# Patient Record
Sex: Female | Born: 1975 | Race: Black or African American | Hispanic: No | Marital: Married
Health system: Southern US, Community
[De-identification: ages and names within clinical notes are randomized; demographics above are authoritative.]

## PROBLEM LIST (undated history)

## (undated) DIAGNOSIS — D219 Benign neoplasm of connective and other soft tissue, unspecified: Secondary | ICD-10-CM

---

## 2015-07-15 ENCOUNTER — Other Ambulatory Visit: Payer: Self-pay | Admitting: Specialist

## 2015-07-15 DIAGNOSIS — D259 Leiomyoma of uterus, unspecified: Secondary | ICD-10-CM

## 2015-07-30 ENCOUNTER — Other Ambulatory Visit (HOSPITAL_COMMUNITY): Payer: Self-pay | Admitting: Diagnostic Radiology

## 2015-07-30 ENCOUNTER — Ambulatory Visit
Admission: RE | Admit: 2015-07-30 | Discharge: 2015-07-30 | Disposition: A | Discharge status: Home / Self Care | Payer: Managed Care, Other (non HMO) | Source: Ambulatory Visit | Attending: Specialist | Admitting: Specialist

## 2015-07-30 DIAGNOSIS — D259 Leiomyoma of uterus, unspecified: Secondary | ICD-10-CM

## 2015-07-30 NOTE — Consult Note (Signed)
Chief Complaint: Patient was seen in consultation today for menorrhagia and uterine fibroids at the request of Dorn,Henry H  Referring Physician(s): Dorn,Henry H  History of Present Illness: Kelsey Gomez is a 39 y.o. female with known uterine fibroids and complains of heavy menstrual bleeding.  Pregnancy history is G3, P2, SA1. Patient was diagnosed with fibroids approximately 16 years ago during a pregnancy. The patient had a tubal ligation a few years ago and stopped taking birth control pills. At that time, the patient started to have increased menstrual bleeding. Initially, the menstrual bleeding was irregular but this improved with Provera. The menstrual cycle is approximately every 23 days with 10 days of bleeding.  4 days of heavy bleeding that requires both tampons and pads every 3 hours. She denies interperiod bleeding.  Patient has chronic fullness or discomfort in the left pelvic region and she says it "feels like she is ovulating all the time." The left pelvic symptoms did not change during her menstrual cycle. Patient has no other significant complaints except for diarrhea related to irritable bowel syndrome. Patient had a urinary tract infection last year but denies other pelvic infections.  Pap smear was negative in March 2015 and no record of an endometrial biopsy.  Patient had a tubal ligation and has no desire to have additional children. 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  Patient is a smoker and trying to quit.    PMH:  Irritable bowel syndrome   PSH: Tubal ligation  Allergies: Doxycycline and Codeine  Medications: Prior to Admission medications   Medication Sig Start Date End Date Taking? Authorizing Provider  buPROPion (WELLBUTRIN SR) 150 MG 12 hr tablet Take 150 mg by mouth daily.   Yes Historical Provider, MD  butalbital-acetaminophen-caffeine (FIORICET, ESGIC) 50-325-40 MG tablet Take 1 tablet by mouth every 6 (six) hours as needed for headache.    Yes Historical Provider, MD  dicyclomine (BENTYL) 10 MG capsule Take 10 mg by mouth 4 (four) times daily -  before meals and at bedtime.   Yes Historical Provider, MD  fluticasone (FLONASE) 50 MCG/ACT nasal spray Place 1 spray into both nostrils daily.   Yes Historical Provider, MD  medroxyPROGESTERone (PROVERA) 10 MG tablet Take 10 mg by mouth daily.   Yes Historical Provider, MD  montelukast (SINGULAIR) 10 MG tablet Take 10 mg by mouth at bedtime.   Yes Historical Provider, MD  nadolol (CORGARD) 40 MG tablet Take 40 mg by mouth daily.   Yes Historical Provider, MD  orphenadrine (NORFLEX) 100 MG tablet Take 100 mg by mouth 2 (two) times daily.   Yes Historical Provider, MD     No family history on file.  Social History   Social History  . Marital Status: Married    Spouse Name: N/A  . Number of Children: N/A  . Years of Education: N/A   Social History Main Topics  . Smoking status: Not on file  . Smokeless tobacco: Not on file  . Alcohol Use: Not on file  . Drug Use: Not on file  . Sexual Activity: Not on file   Other Topics Concern  . Not on file   Social History Narrative  . No narrative on file    Review of Systems  Constitutional: Negative.   Respiratory: Negative.   Cardiovascular: Negative.   Gastrointestinal: Positive for diarrhea.  Genitourinary: Positive for vaginal bleeding and pelvic pain.  Neurological: Negative.     Vital Signs: BP 110/79 mmHg  Pulse 106  Temp(Src) 98.3 F (36.8 C)  Resp 18  Ht 5' 5.5" (1.664 m)  Wt 156 lb (70.761 kg)  BMI 25.56 kg/m2  SpO2 100%  LMP 07/10/2015  Physical Exam  Constitutional: She appears well-developed and well-nourished.  Cardiovascular: Normal rate, regular rhythm, normal heart sounds and intact distal pulses.  Exam reveals no gallop and no friction rub.   No murmur heard. Pulmonary/Chest: Effort normal and breath sounds normal. No respiratory distress. She has no wheezes. She has no rales. She exhibits no  tenderness.  Abdominal: Soft. Bowel sounds are normal. There is tenderness.  Tenderness in left anterior pelvis.  Musculoskeletal:  No pedal edema.  Palpable DP pulses bilaterally        Imaging: Outside ultrasound - July 2016.  Uterus measures 9.8 x 6.8 x 6.1 cm with multiple fibroids.  0.8 cm endometrium.  Adnexal tissue appears unremarkable.  Labs:  CBC: No results for input(s): WBC, HGB, HCT, PLT in the last 8760 hours.  COAGS: No results for input(s): INR, APTT in the last 8760 hours.  BMP: No results for input(s): NA, K, CL, CO2, GLUCOSE, BUN, CALCIUM, CREATININE, GFRNONAA, GFRAA in the last 8760 hours.  Invalid input(s): CMP  LIVER FUNCTION TESTS: No results for input(s): BILITOT, AST, ALT, ALKPHOS, PROT, ALBUMIN in the last 8760 hours.  TUMOR MARKERS: No results for input(s): AFPTM, CEA, CA199, CHROMGRNA in the last 8760 hours.  Assessment and Plan:  39 year old with uterine fibroids based on prior ultrasound. Patient's main complaint is menorrhagia and the bleeding is life-style limiting during the heavy bleeding times. We discussed treatment options for uterine fibroids.  We went over the uterine artery embolization procedure in depth, including the risks and benefits which include but not limited to bleeding and infection. We discussed the post procedure care which includes overnight observation in the hospital for management of post embolization syndrome. Patient is a Marine scientist and understands that she will be out of work for 1-2 weeks. The patient would like to pursue the uterine artery embolization option and we will plan to get an MRI of the pelvis to further evaluate the uterus and fibroid disease. Following the MRI, we will contact the patient and let her know if she is a candidate for the procedure. In addition, we will request an endometrial biopsy.  If the patient is a candidate for the uterine artery embolization, the patient would like to have the procedure done at  [facility].  Thank you for this interesting consult.  I greatly enjoyed meeting [facility] and look forward to participating in their care.  A copy of this report was sent to the requesting provider on this date.  SignedCarylon Perches 07/30/2015, 6:33 PM   I spent a total of  30 Minutes  in face to face in clinical consultation, greater than 50% of which was counseling/coordinating care for uterine fibroids and menorrhagia.

## 2015-07-31 DIAGNOSIS — D259 Leiomyoma of uterus, unspecified: Secondary | ICD-10-CM | POA: Insufficient documentation

## 2015-08-08 ENCOUNTER — Ambulatory Visit (HOSPITAL_COMMUNITY)
Admission: RE | Admit: 2015-08-08 | Discharge: 2015-08-08 | Disposition: A | Discharge status: Home / Self Care | Payer: Managed Care, Other (non HMO) | Source: Ambulatory Visit | Attending: Diagnostic Radiology | Admitting: Diagnostic Radiology

## 2015-08-08 DIAGNOSIS — N888 Other specified noninflammatory disorders of cervix uteri: Secondary | ICD-10-CM | POA: Insufficient documentation

## 2015-08-08 DIAGNOSIS — Z01818 Encounter for other preprocedural examination: Secondary | ICD-10-CM | POA: Insufficient documentation

## 2015-08-08 DIAGNOSIS — D251 Intramural leiomyoma of uterus: Secondary | ICD-10-CM | POA: Diagnosis not present

## 2015-08-08 DIAGNOSIS — D252 Subserosal leiomyoma of uterus: Secondary | ICD-10-CM | POA: Insufficient documentation

## 2015-08-08 DIAGNOSIS — D259 Leiomyoma of uterus, unspecified: Secondary | ICD-10-CM

## 2015-08-08 MED ORDER — GADOBENATE DIMEGLUMINE 529 MG/ML IV SOLN
15.0000 mL | Freq: Once | INTRAVENOUS | Status: AC | PRN
  Administered 2015-08-08: 14 mL via INTRAVENOUS

## 2015-09-12 HISTORY — PX: UTERINE ARTERY EMBOLIZATION: SHX2629

## 2015-09-13 ENCOUNTER — Other Ambulatory Visit: Payer: Self-pay | Admitting: Physician Assistant

## 2015-09-13 DIAGNOSIS — D219 Benign neoplasm of connective and other soft tissue, unspecified: Secondary | ICD-10-CM

## 2015-09-24 ENCOUNTER — Encounter: Payer: Self-pay | Admitting: Radiology

## 2015-10-08 ENCOUNTER — Other Ambulatory Visit: Payer: Managed Care, Other (non HMO)

## 2015-10-08 ENCOUNTER — Ambulatory Visit
Admission: RE | Admit: 2015-10-08 | Discharge: 2015-10-08 | Disposition: A | Discharge status: Home / Self Care | Payer: Managed Care, Other (non HMO) | Source: Ambulatory Visit | Attending: Physician Assistant | Admitting: Physician Assistant

## 2015-10-08 DIAGNOSIS — D219 Benign neoplasm of connective and other soft tissue, unspecified: Secondary | ICD-10-CM

## 2015-10-08 NOTE — Consult Note (Signed)
Chief Complaint: Patient was seen in consultation today for  Chief Complaint  Patient presents with  . Follow-up    4 wk follow up [place]     Referring Physician(s): Jac Canavan   History of Present Illness: Kelsey Gomez is a 40 y.o. female with history of uterine fibroids and menorrhagia. The patient underwent uterine artery embolization procedure at [facility] on 09/12/2015. The patient was discharged from the hospital one day following the procedure. Patient said that she was feeling pretty good a few days after the procedure. However, the patient started a menstrual cycle the week following the procedure which lasted approximately 8 days with heavy flow. The patient had another menstrual period last week with the 3 days of heavy flow and heavy cramping. At this time, the patient is having some spotting and occasional cramping. The patient's chronic fullness and pelvic discomfort has resolved since the procedure. She is no longer complaining of urinary or bowel problems. The patient denies any fevers or cramping. Patient denies foul-smelling vaginal discharge. The patient did have a tooth abscess around [place] which required a root canal and she took antibiotics for that procedure.  No past medical history on file.  No past surgical history on file.  Allergies: Doxycycline and Codeine  Medications: Prior to Admission medications   Medication Sig Start Date End Date Taking? Authorizing Provider  buPROPion (WELLBUTRIN SR) 150 MG 12 hr tablet Take 150 mg by mouth daily.   Yes Historical Provider, MD  butalbital-acetaminophen-caffeine (FIORICET, ESGIC) 50-325-40 MG tablet Take 1 tablet by mouth every 6 (six) hours as needed for headache.   Yes Historical Provider, MD  fluticasone (FLONASE) 50 MCG/ACT nasal spray Place 1 spray into both nostrils daily.   Yes Historical Provider, MD  ibuprofen (ADVIL,MOTRIN) 800 MG tablet Take 800 mg by mouth 3 (three) times daily as  needed.   Yes Historical Provider, MD  montelukast (SINGULAIR) 10 MG tablet Take 10 mg by mouth at bedtime.   Yes Historical Provider, MD  nadolol (CORGARD) 40 MG tablet Take 40 mg by mouth daily.   Yes Historical Provider, MD  orphenadrine (NORFLEX) 100 MG tablet Take 100 mg by mouth 2 (two) times daily.   Yes Historical Provider, MD  oxyCODONE-acetaminophen (PERCOCET/ROXICET) 5-325 MG tablet Take 1 tablet by mouth every 6 (six) hours as needed for severe pain.   Yes Historical Provider, MD  dicyclomine (BENTYL) 10 MG capsule Take 10 mg by mouth 4 (four) times daily -  before meals and at bedtime. Reported on 10/08/2015    Historical Provider, MD  medroxyPROGESTERone (PROVERA) 10 MG tablet Take 10 mg by mouth daily. Reported on 10/08/2015    Historical Provider, MD     No family history on file.  Social History   Social History  . Marital Status: Married    Spouse Name: N/A  . Number of Children: N/A  . Years of Education: N/A   Social History Main Topics  . Smoking status: Not on file  . Smokeless tobacco: Not on file  . Alcohol Use: Not on file  . Drug Use: Not on file  . Sexual Activity: Not on file   Other Topics Concern  . Not on file   Social History Narrative  . No narrative on file      Review of Systems  Constitutional: Negative for fever and chills.  Respiratory: Negative.   Cardiovascular: Negative.   Gastrointestinal: Negative for diarrhea, constipation and abdominal distention.  Genitourinary: Positive for menstrual problem. Negative for difficulty urinating.    Vital Signs: BP 114/69 mmHg  Pulse 82  Temp(Src) 98 F (36.7 C) (Oral)  Resp 15  SpO2 100%  LMP 10/02/2015 (Exact Date)  Physical Exam  Constitutional: She appears well-developed.  Cardiovascular: Normal rate, regular rhythm, normal heart sounds and intact distal pulses.   Pulmonary/Chest: Effort normal and breath sounds normal.  Abdominal: Soft. She exhibits no distension. There is no  tenderness.  Musculoskeletal:  Right groin puncture site is well healed.        Imaging: No results found.  Labs:  CBC: No results for input(s): WBC, HGB, HCT, PLT in the last 8760 hours.  COAGS: No results for input(s): INR, APTT in the last 8760 hours.  BMP: No results for input(s): NA, K, CL, CO2, GLUCOSE, BUN, CALCIUM, CREATININE, GFRNONAA, GFRAA in the last 8760 hours.  Invalid input(s): CMP  LIVER FUNCTION TESTS: No results for input(s): BILITOT, AST, ALT, ALKPHOS, PROT, ALBUMIN in the last 8760 hours.  TUMOR MARKERS: No results for input(s): AFPTM, CEA, CA199, CHROMGRNA in the last 8760 hours.  Assessment and Plan:  40 year old with uterine fibroids and history of menorrhagia. Patient underwent uterine artery embolization procedure on 09/12/2015. She has done fairly well following the embolization procedure except she is still having frequent and heavy menstrual bleeding. She continues to have cramping along with the menstrual bleeding. The pelvic fullness and her urinary and bowel symptoms have improved since the procedure. No evidence for endometrial or vaginal infection. I assured the patient that it can take a few months before the menstrual bleeding decreases after the embolization procedure. I am encouraged that the pelvic fullness has already improved.  We will follow-up with the patient by telephone in 2-3 months. Patient will contact us if she has any problems in the interim. Otherwise, we will plan to her in 6 months with a follow-up pelvic MRI.  Thank you for this interesting consult.  I greatly enjoyed meeting [name] and look forward to participating in their care.  A copy of this report was sent to the requesting provider on this date.  SignedCarylon Perches 10/08/2015, 4:10 PM   I spent a total of   10 Minutes in face to face in clinical consultation, greater than 50% of which was counseling/coordinating care for uterine fibroids.

## 2016-02-18 ENCOUNTER — Other Ambulatory Visit (HOSPITAL_COMMUNITY): Payer: Self-pay | Admitting: Diagnostic Radiology

## 2016-02-18 DIAGNOSIS — D259 Leiomyoma of uterus, unspecified: Secondary | ICD-10-CM

## 2016-03-13 ENCOUNTER — Ambulatory Visit (HOSPITAL_COMMUNITY)
Admission: RE | Admit: 2016-03-13 | Discharge: 2016-03-13 | Disposition: A | Discharge status: Home / Self Care | Payer: Managed Care, Other (non HMO) | Source: Ambulatory Visit | Attending: Diagnostic Radiology | Admitting: Diagnostic Radiology

## 2016-03-13 DIAGNOSIS — D259 Leiomyoma of uterus, unspecified: Secondary | ICD-10-CM

## 2016-03-13 MED ORDER — GADOBENATE DIMEGLUMINE 529 MG/ML IV SOLN
15.0000 mL | Freq: Once | INTRAVENOUS | Status: AC | PRN
  Administered 2016-03-13: 14 mL via INTRAVENOUS

## 2016-03-17 ENCOUNTER — Ambulatory Visit
Admission: RE | Admit: 2016-03-17 | Discharge: 2016-03-17 | Disposition: A | Discharge status: Home / Self Care | Payer: Managed Care, Other (non HMO) | Source: Ambulatory Visit | Attending: Diagnostic Radiology | Admitting: Diagnostic Radiology

## 2016-03-17 DIAGNOSIS — D259 Leiomyoma of uterus, unspecified: Secondary | ICD-10-CM

## 2016-03-17 HISTORY — DX: Benign neoplasm of connective and other soft tissue, unspecified: D21.9

## 2016-03-17 NOTE — Progress Notes (Signed)
Chief Complaint: Patient was seen in consultation today for  Chief Complaint  Patient presents with  . Follow-up    6 mo follow up [place]     Referring Physician(s):  Jac Canavan MD.  Leta Speller MD  History of Present Illness:  Kelsey Gomez is a 40 y.o. female with history of menorrhagia and uterine fibroids. Patient underwent uterine artery embolization procedure on 09/12/2015. The immediate postprocedure course was unremarkable. However, the patient has had 2 episodes of bacterial vaginosis since the procedure. Patient continues to have heavy menstrual bleeding lasting 3-4 days. The duration of menstrual bleeding now last 7-8 days opposed to 10 days prior to the procedure. She continues to have vague left lower quadrant pain. The menstrual cramping has improved since the procedure. The irritable bowel symptoms with diarrhea have essentially resolved since the procedure.  Past Medical History  Diagnosis Date  . Fibroids     Past Surgical History  Procedure Laterality Date  . Uterine artery embolization  09/12/2015    Allergies: Doxycycline and Codeine  Medications: Prior to Admission medications   Medication Sig Start Date End Date Taking? Authorizing Provider  buPROPion (WELLBUTRIN SR) 150 MG 12 hr tablet Take 150 mg by mouth daily.   Yes Historical Provider, MD  butalbital-acetaminophen-caffeine (FIORICET, ESGIC) 50-325-40 MG tablet Take 1 tablet by mouth every 6 (six) hours as needed for headache.   Yes Historical Provider, MD  dicyclomine (BENTYL) 10 MG capsule Take 10 mg by mouth 4 (four) times daily -  before meals and at bedtime. Reported on 10/08/2015   Yes Historical Provider, MD  fluticasone (FLONASE) 50 MCG/ACT nasal spray Place 1 spray into both nostrils daily.   Yes Historical Provider, MD  ibuprofen (ADVIL,MOTRIN) 800 MG tablet Take 800 mg by mouth 3 (three) times daily as needed.   Yes Historical Provider, MD  montelukast (SINGULAIR) 10 MG tablet Take 10 mg by  mouth at bedtime.   Yes Historical Provider, MD  nadolol (CORGARD) 40 MG tablet Take 40 mg by mouth daily.   Yes Historical Provider, MD  orphenadrine (NORFLEX) 100 MG tablet Take 100 mg by mouth 2 (two) times daily. Reported on 03/17/2016   Yes Historical Provider, MD  medroxyPROGESTERone (PROVERA) 10 MG tablet Take 10 mg by mouth daily. Reported on 03/17/2016    Historical Provider, MD  oxyCODONE-acetaminophen (PERCOCET/ROXICET) 5-325 MG tablet Take 1 tablet by mouth every 6 (six) hours as needed for severe pain. Reported on 03/17/2016    Historical Provider, MD     No family history on file.  Social History   Social History  . Marital Status: Married    Spouse Name: N/A  . Number of Children: N/A  . Years of Education: N/A   Social History Main Topics  . Smoking status: Not on file  . Smokeless tobacco: Not on file  . Alcohol Use: Not on file  . Drug Use: Not on file  . Sexual Activity: Not on file   Other Topics Concern  . Not on file   Social History Narrative  . No narrative on file     Review of Systems  Constitutional: Negative for activity change.  Respiratory: Negative.   Cardiovascular: Negative.   Gastrointestinal:       LLQ pain.  Genitourinary: Positive for vaginal bleeding and pelvic pain.    Vital Signs: BP 100/63 mmHg  Pulse 86  Temp(Src) 98.1 F (36.7 C) (Oral)  Resp 14  Ht 5\' 5"  (1.651 m)  Wt 158 lb (71.668 kg)  BMI 26.29 kg/m2  SpO2 100%  LMP 03/01/2016 (Exact Date)  Physical Exam  Constitutional: She appears well-developed and well-nourished.  Cardiovascular: Normal rate, regular rhythm and normal heart sounds.   Pulmonary/Chest: Effort normal and breath sounds normal.  Abdominal: Soft. Bowel sounds are normal. She exhibits no distension. There is no tenderness.    Mallampati Score:     Imaging: Mr Pelvis W Wo Contrast  03/13/2016  CLINICAL DATA:  Follow-up uterine artery embolization performed at [facility]  09/12/2015. EXAM: MRI PELVIS WITHOUT AND WITH CONTRAST TECHNIQUE: Multiplanar multisequence MR imaging of the pelvis was performed both before and after administration of intravenous contrast. CONTRAST:  32mL MULTIHANCE GADOBENATE DIMEGLUMINE 529 MG/ML IV SOLN COMPARISON:  Pre treatment examination 08/08/2015 FINDINGS: Urinary Tract: The visualized distal ureters and bladder appear unremarkable. Bowel: No bowel wall thickening, distention or surrounding inflammation identified within the pelvis. Vascular/Lymphatic: No enlarged pelvic lymph nodes identified. No significant vascular findings. Reproductive: Uterus: Measures approximately 8.0 x 7.5 x 7.0 cm. Several intramural fibroids are again noted, very similar in size to the prior study. Representative lesions (measured on axial series 4) include a 3.2 x 2.0 cm lesion in the left fundal region (previously 2.9 x 1.9 cm), and a 2.0 x 1.5 cm lesion in the left lower uterine segment (previously 2.2 x 1.3 cm). The fibroids demonstrate fairly homogeneous enhancement following contrast. No devascularized lesions are identified. The junctional zone is well-defined, although mildly thickened to 13 mm. Endometrium:  Unremarkable. Cervix/Vagina:  Appears normal. Right ovary:  Appears normal.  No mass identified. Left ovary:  Appears normal.  No mass identified. Other: No ascites. Musculoskeletal: No acute or worrisome osseous findings. IMPRESSION: 1. No significant change in overall size of the uterus or the previous demonstrated multiple intramural fibroids. The fibroids demonstrate persistent enhancement following contrast. 2. Thickened junctional zone of the uterus suspicious for underlying adenomyosis. 3. No suspicious adnexal findings. Electronically Signed   By: Richardean Sale M.D.   On: 03/13/2016 10:40    Labs:  CBC: No results for input(s): WBC, HGB, HCT, PLT in the last 8760 hours.  COAGS: No results for input(s): INR, APTT in the last 8760  hours.  BMP: No results for input(s): NA, K, CL, CO2, GLUCOSE, BUN, CALCIUM, CREATININE, GFRNONAA, GFRAA in the last 8760 hours.  Invalid input(s): CMP  LIVER FUNCTION TESTS: No results for input(s): BILITOT, AST, ALT, ALKPHOS, PROT, ALBUMIN in the last 8760 hours.  TUMOR MARKERS: No results for input(s): AFPTM, CEA, CA199, CHROMGRNA in the last 8760 hours.  Assessment and Plan:  40 year old with menorrhagia. Patient has known uterine fibroids and underwent uterine artery embolization procedure 6 months ago. Unfortunately, there has been no significant change in the size or enhancement of the uterine fibroids since the procedure. In addition, the patient's heavy menstrual bleeding has only minimally improved since the procedure. I reviewed the patient's procedure images and confirmed that bilateral uterine arteries were effectively embolized. The patient also had the expected post embolization syndrome for 1-2 weeks following the procedure. I suspect that the small uterine fibroids are not getting their primary blood supply only from the uterine arteries as would be expected. In addition, the recent MRI raised concern for a thickened junctional zone and adenomyosis. I suspect that the patient's symptoms are related to adenomyosis rather than the small uterine fibroids. I do not think that another uterine embolization procedure is worthwhile. I think the patient needs to be evaluated  by a gynecologist for adenomyosis treatment. I reviewed the MRI findings with the patient and discussed the plan. We will set the patient up with a gynecologist appointment.  Thank you for this interesting consult.  I greatly enjoyed meeting [facility] and look forward to participating in their care.  A copy of this report was sent to the requesting provider on this date.  Electronically Signed: Carylon Perches 03/17/2016, 4:08 PM   I spent a total of  15 Minutes in face to face in clinical consultation, greater  than 50% of which was counseling/coordinating care for menorrhagia.

## 2016-03-18 ENCOUNTER — Telehealth: Payer: Self-pay | Admitting: Radiology

## 2016-03-18 NOTE — Telephone Encounter (Signed)
Left msg to follow up w/ patient regarding Dr Moises Blood recommendation to schedule app't with GYN.  Need to determine if patient plans to call for an app't or if she needs a GYN referral.    Reece Levy, RN 03/18/2016 11:50 AM

## 2017-08-31 IMAGING — MR MR PELVIS WO/W CM
9 of 12 series · 27 of 48 positions shown · IV contrast (yes)
Comparison: Pre treatment examination 08/08/2015

CLINICAL DATA: Follow-up uterine artery embolization performed at
[REDACTED] 09/12/2015.

EXAM:
MRI PELVIS WITHOUT AND WITH CONTRAST
TECHNIQUE: Multiplanar multisequence MR imaging of the pelvis was performed
both before and after administration of intravenous contrast.
CONTRAST:  14mL MULTIHANCE GADOBENATE DIMEGLUMINE 529 MG/ML IV SOLN

[Series 3: T2 · coronal · 5.0mm · 0.47mm/px · 1 of 30 slices shown (1 of 3)]
[im 1/30]
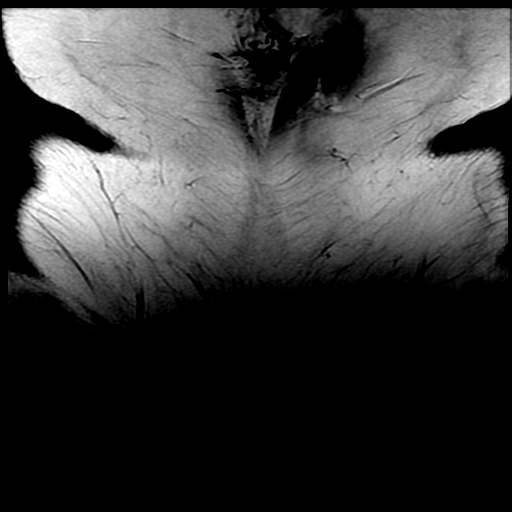

[Series 4: T2 · axial · 5.0mm · 0.47mm/px · z∈[-66,+132]mm · 2 of 34 slices shown (2 of 3)]
[im 1/34]
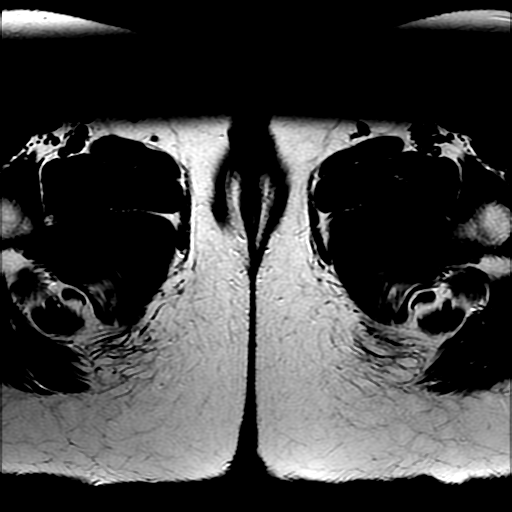
[im 34/34]
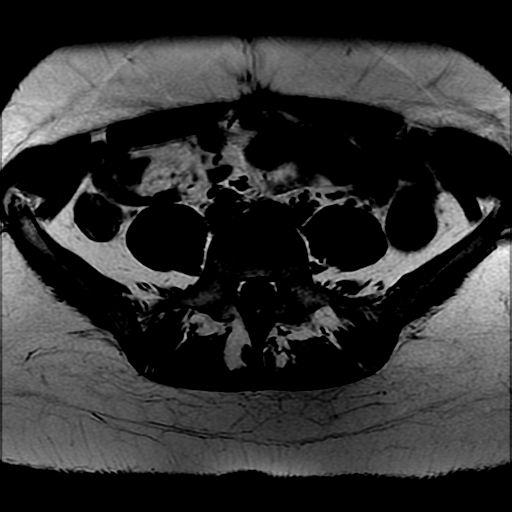

[Series 5: T2 fat-sat · axial · 5.0mm · 0.47mm/px · z∈[-66,+132]mm · 2 of 34 slices shown]
[im 1/34]
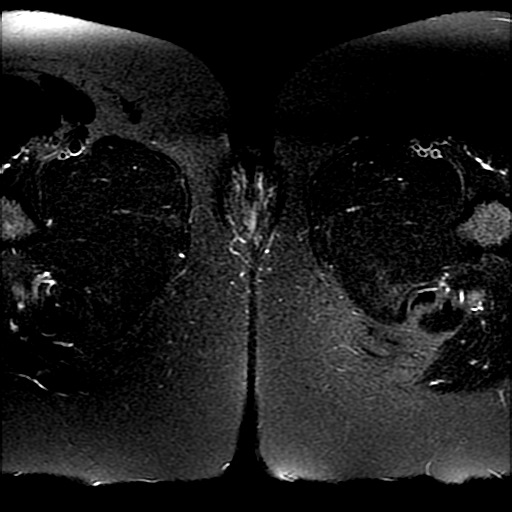
[im 34/34]
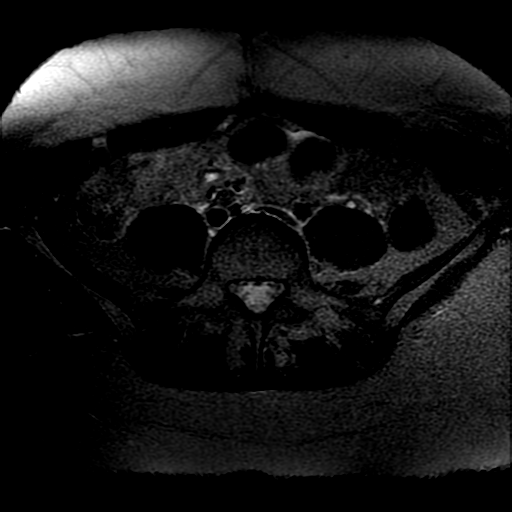

[Series 6: T2 · sagittal · 5.0mm · 0.47mm/px · 2 of 26 slices shown (3 of 3)]
[im 1/26]
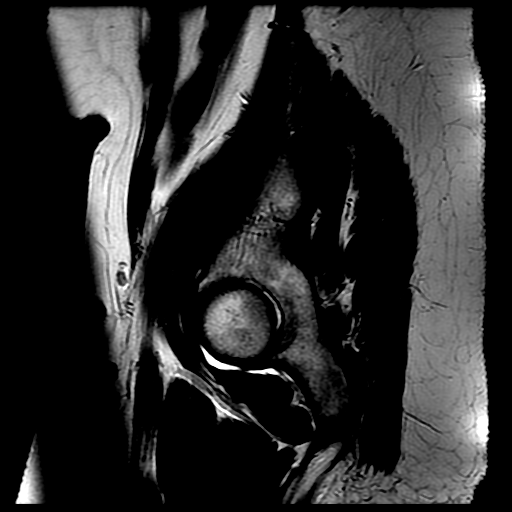
[im 26/26]
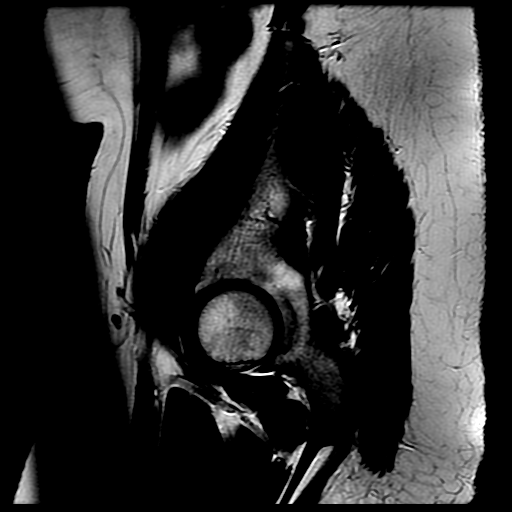

[Series 7: T1 · axial · 5.0mm · 0.47mm/px · z∈[-66,+132]mm · 2 of 34 slices shown]
[im 1/34]
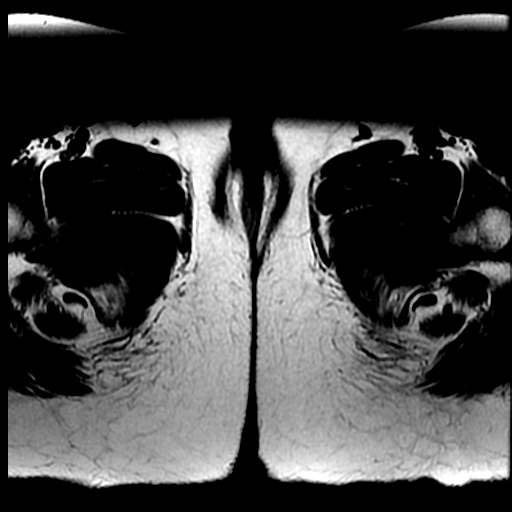
[im 34/34]
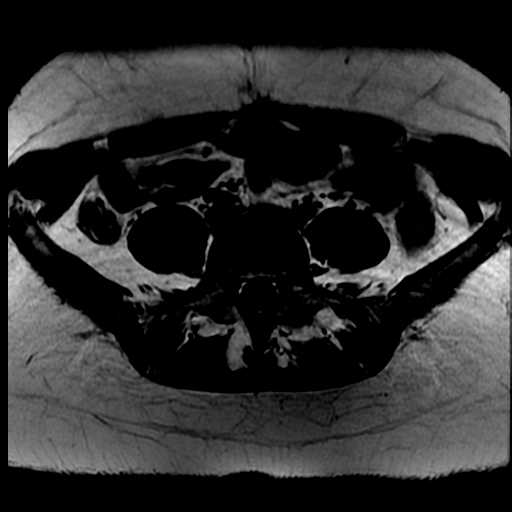

[Series 9: T1 fat-sat post-contrast · sagittal · 5.0mm · 0.47mm/px · 2 of 26 slices shown]
[im 1/26]
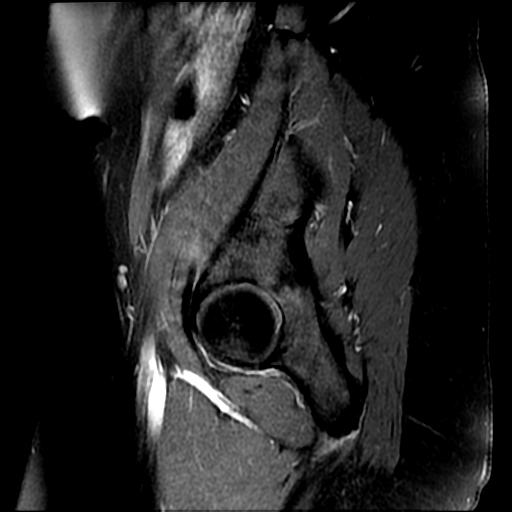
[im 26/26]
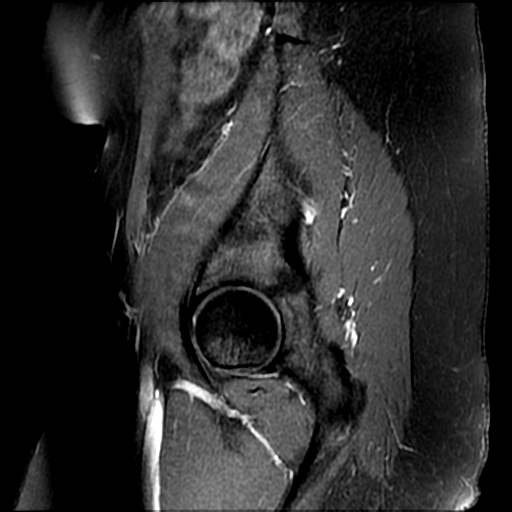

[Series 10: T2 post-contrast · sagittal · 5.0mm · 0.47mm/px · 2 of 26 slices shown]
[im 1/26]
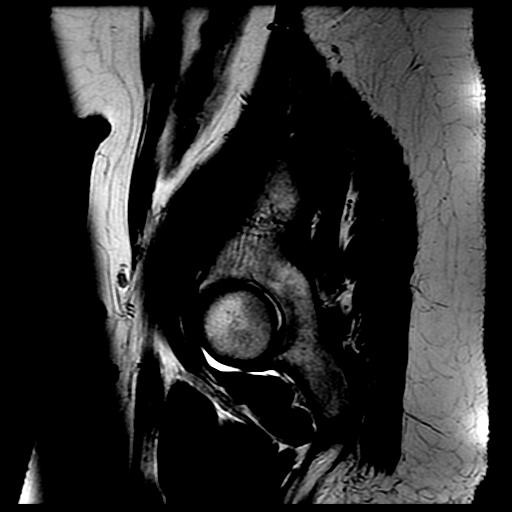
[im 26/26]
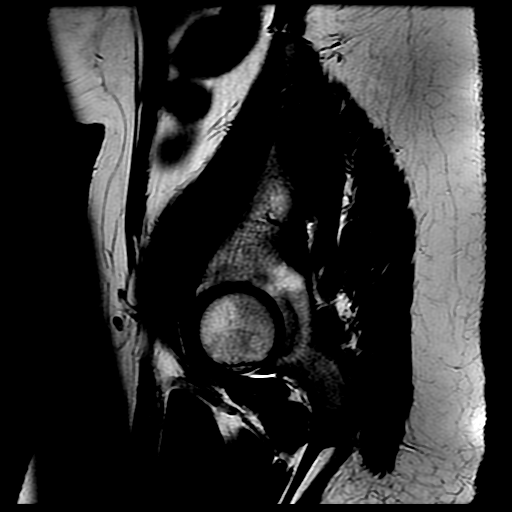

[((id)/801/1)-((id)/800/1) · axial · 4.2mm · 1.41mm/px · z∈[-68,+132]mm · 7 of 96 slices shown]
[im 1/96]
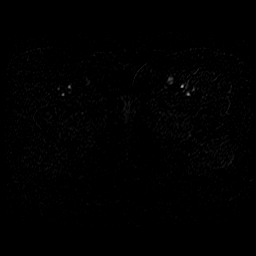
[im 16/96]
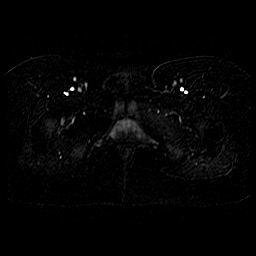
[im 32/96]
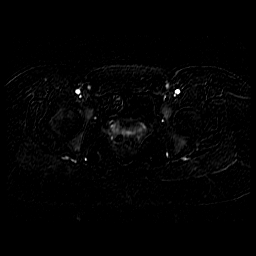
[im 48/96]
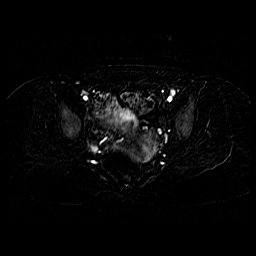
[im 64/96]
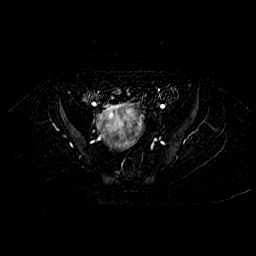
[im 80/96]
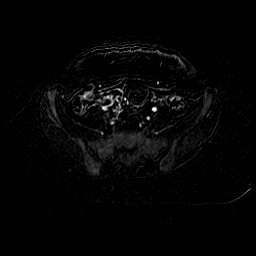
[im 96/96]
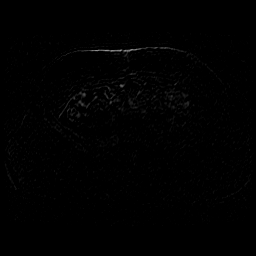

[((id)/802/1)-((id)/800/1) · axial · 4.2mm · 1.41mm/px · z∈[-68,+132]mm · 7 of 96 slices shown]
[im 1/96]
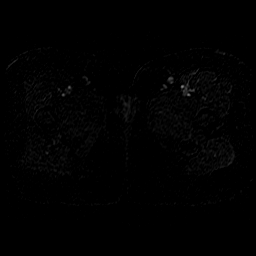
[im 16/96]
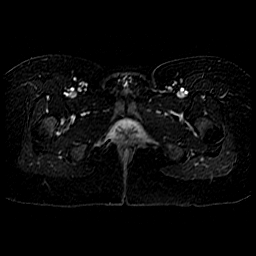
[im 32/96]
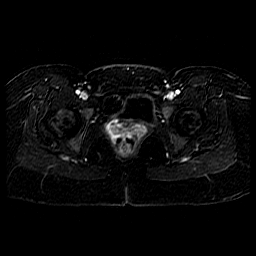
[im 48/96]
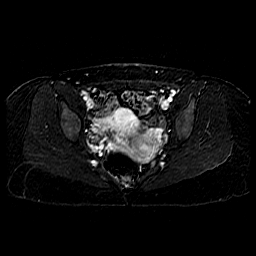
[im 64/96]
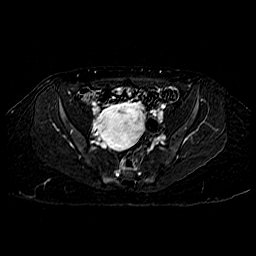
[im 80/96]
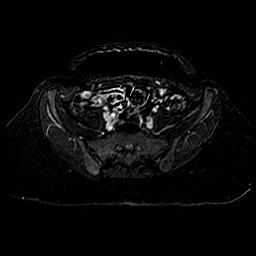
[im 96/96]
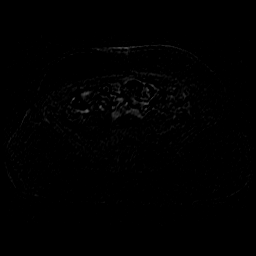

[27 of 48 positions shown; findings below may reference images not displayed]

FINDINGS: Urinary Tract: The visualized distal ureters and bladder appear
unremarkable.

Bowel: No bowel wall thickening, distention or surrounding
inflammation identified within the pelvis.

Vascular/Lymphatic: No enlarged pelvic lymph nodes identified. No
significant vascular findings.

Reproductive:

Uterus: Measures approximately 8.0 x 7.5 x 7.0 cm. Several
intramural fibroids are again noted, very similar in size to the
prior study. Representative lesions (measured on axial series 4)
include a 3.2 x 2.0 cm lesion in the left fundal region (previously
2.9 x 1.9 cm), and a 2.0 x 1.5 cm lesion in the left lower uterine
segment (previously 2.2 x 1.3 cm). The fibroids demonstrate fairly
homogeneous enhancement following contrast. No devascularized
lesions are identified. The junctional zone is well-defined,
although mildly thickened to 13 mm.

Endometrium:  Unremarkable.

Cervix/Vagina:  Appears normal.

Right ovary:  Appears normal.  No mass identified.

Left ovary:  Appears normal.  No mass identified.

Other: No ascites.

Musculoskeletal: No acute or worrisome osseous findings.
IMPRESSION: 1. No significant change in overall size of the uterus or the
previous demonstrated multiple intramural fibroids. The fibroids
demonstrate persistent enhancement following contrast.
2. Thickened junctional zone of the uterus suspicious for underlying
adenomyosis.
3. No suspicious adnexal findings.
